# Patient Record
Sex: Male | Born: 1985 | Race: Black or African American | Hispanic: No | Marital: Single | Smoking: Current every day smoker
Health system: Southern US, Community
[De-identification: ages and names within clinical notes are randomized; demographics above are authoritative.]

## PROBLEM LIST (undated history)

## (undated) DIAGNOSIS — B20 Human immunodeficiency virus [HIV] disease: Secondary | ICD-10-CM

---

## 2016-09-02 ENCOUNTER — Encounter (HOSPITAL_COMMUNITY): Payer: Self-pay | Admitting: Emergency Medicine

## 2016-09-02 ENCOUNTER — Emergency Department (HOSPITAL_COMMUNITY)
Admission: EM | Admit: 2016-09-02 | Discharge: 2016-09-02 | Disposition: A | Discharge status: Home / Self Care | Payer: Self-pay | Attending: Emergency Medicine | Admitting: Emergency Medicine

## 2016-09-02 DIAGNOSIS — F10920 Alcohol use, unspecified with intoxication, uncomplicated: Secondary | ICD-10-CM

## 2016-09-02 DIAGNOSIS — R112 Nausea with vomiting, unspecified: Secondary | ICD-10-CM | POA: Insufficient documentation

## 2016-09-02 DIAGNOSIS — F17210 Nicotine dependence, cigarettes, uncomplicated: Secondary | ICD-10-CM | POA: Insufficient documentation

## 2016-09-02 DIAGNOSIS — F10120 Alcohol abuse with intoxication, uncomplicated: Secondary | ICD-10-CM | POA: Insufficient documentation

## 2016-09-02 HISTORY — DX: Human immunodeficiency virus (HIV) disease: B20

## 2016-09-02 LAB — CBC WITH DIFFERENTIAL/PLATELET
BASOS ABS: 0 10*3/uL (ref 0.0–0.1)
BASOS PCT: 0 %
Eosinophils Absolute: 0 10*3/uL (ref 0.0–0.7)
Eosinophils Relative: 0 %
HEMATOCRIT: 41.8 % (ref 39.0–52.0)
HEMOGLOBIN: 15.2 g/dL (ref 13.0–17.0)
Lymphocytes Relative: 31 %
Lymphs Abs: 2.1 10*3/uL (ref 0.7–4.0)
MCH: 32.3 pg (ref 26.0–34.0)
MCHC: 36.4 g/dL — ABNORMAL HIGH (ref 30.0–36.0)
MCV: 88.9 fL (ref 78.0–100.0)
Monocytes Absolute: 0.6 10*3/uL (ref 0.1–1.0)
Monocytes Relative: 9 %
NEUTROS ABS: 4.1 10*3/uL (ref 1.7–7.7)
NEUTROS PCT: 60 %
Platelets: 160 10*3/uL (ref 150–400)
RBC: 4.7 MIL/uL (ref 4.22–5.81)
RDW: 13.2 % (ref 11.5–15.5)
WBC: 6.8 10*3/uL (ref 4.0–10.5)

## 2016-09-02 LAB — URINALYSIS, ROUTINE W REFLEX MICROSCOPIC
Bilirubin Urine: NEGATIVE
Glucose, UA: NEGATIVE mg/dL
KETONES UR: 15 mg/dL — AB
LEUKOCYTES UA: NEGATIVE
NITRITE: NEGATIVE
PH: 6 (ref 5.0–8.0)
Protein, ur: 100 mg/dL — AB
SPECIFIC GRAVITY, URINE: 1.039 — AB (ref 1.005–1.030)

## 2016-09-02 LAB — COMPREHENSIVE METABOLIC PANEL
ALK PHOS: 94 U/L (ref 38–126)
ALT: 124 U/L — ABNORMAL HIGH (ref 17–63)
ANION GAP: 15 (ref 5–15)
AST: 176 U/L — ABNORMAL HIGH (ref 15–41)
Albumin: 4.6 g/dL (ref 3.5–5.0)
BILIRUBIN TOTAL: 0.8 mg/dL (ref 0.3–1.2)
BUN: 6 mg/dL (ref 6–20)
CALCIUM: 9.3 mg/dL (ref 8.9–10.3)
CO2: 25 mmol/L (ref 22–32)
Chloride: 100 mmol/L — ABNORMAL LOW (ref 101–111)
Creatinine, Ser: 0.76 mg/dL (ref 0.61–1.24)
GFR calc non Af Amer: >60 mL/min (ref >60)
Glucose, Bld: 115 mg/dL — ABNORMAL HIGH (ref 65–99)
POTASSIUM: 3.1 mmol/L — AB (ref 3.5–5.1)
SODIUM: 140 mmol/L (ref 135–145)
TOTAL PROTEIN: 8.3 g/dL — AB (ref 6.5–8.1)

## 2016-09-02 LAB — URINE MICROSCOPIC-ADD ON

## 2016-09-02 LAB — ETHANOL: Alcohol, Ethyl (B): 242 mg/dL — ABNORMAL HIGH (ref <5)

## 2016-09-02 MED ORDER — SODIUM CHLORIDE 0.9 % IV SOLN
1000.0000 mL | Freq: Once | INTRAVENOUS | Status: AC
  Administered 2016-09-02: 1000 mL via INTRAVENOUS

## 2016-09-02 MED ORDER — LORAZEPAM 1 MG PO TABS
1.0000 mg | ORAL_TABLET | Freq: Four times a day (QID) | ORAL | 0 refills | Status: AC | PRN
Start: 2016-09-02 — End: ?

## 2016-09-02 MED ORDER — POTASSIUM CHLORIDE CRYS ER 20 MEQ PO TBCR
40.0000 meq | EXTENDED_RELEASE_TABLET | Freq: Once | ORAL | Status: DC
  Filled 2016-09-02: qty 2

## 2016-09-02 MED ORDER — LORAZEPAM 2 MG/ML IJ SOLN
1.0000 mg | Freq: Once | INTRAMUSCULAR | Status: AC
  Administered 2016-09-02: 1 mg via INTRAVENOUS
  Filled 2016-09-02: qty 1

## 2016-09-02 MED ORDER — ONDANSETRON HCL 4 MG PO TABS: 4.0000 mg | ORAL_TABLET | Freq: Four times a day (QID) | ORAL | 0 refills | Status: AC

## 2016-09-02 MED ORDER — DIPHENHYDRAMINE HCL 50 MG/ML IJ SOLN
25.0000 mg | Freq: Once | INTRAMUSCULAR | Status: AC
  Administered 2016-09-02: 25 mg via INTRAVENOUS
  Filled 2016-09-02: qty 1

## 2016-09-02 MED ORDER — SODIUM CHLORIDE 0.9 % IV SOLN
1000.0000 mL | INTRAVENOUS | Status: DC
  Administered 2016-09-02: 1000 mL via INTRAVENOUS

## 2016-09-02 MED ORDER — ONDANSETRON HCL 4 MG/2ML IJ SOLN
4.0000 mg | Freq: Once | INTRAMUSCULAR | Status: AC
  Administered 2016-09-02: 4 mg via INTRAVENOUS
  Filled 2016-09-02: qty 2

## 2016-09-02 MED ORDER — METOCLOPRAMIDE HCL 5 MG/ML IJ SOLN
10.0000 mg | Freq: Once | INTRAMUSCULAR | Status: AC
Start: 2016-09-02 — End: 2016-09-02
  Administered 2016-09-02: 10 mg via INTRAVENOUS
  Filled 2016-09-02: qty 2

## 2016-09-02 MED ORDER — POTASSIUM CHLORIDE 10 MEQ/100ML IV SOLN
10.0000 meq | Freq: Once | INTRAVENOUS | Status: AC
  Administered 2016-09-02: 10 meq via INTRAVENOUS
  Filled 2016-09-02: qty 100

## 2016-09-02 NOTE — ED Provider Notes (Signed)
[facility] DEPT Provider Note   CSN: [id number] Arrival date & time: 09/02/16  0205  By signing my name below, I, Hansel Feinstein, attest that this documentation has been prepared under the direction and in the presence of Delora Fuel, MD. Electronically Signed: Hansel Feinstein, ED Scribe. 09/02/16. 2:36 AM.     History   Chief Complaint Chief Complaint  Patient presents with  . Abdominal Pain  . Emesis  . Diarrhea    HPI Jesse Medina is a 30 y.o. male with h/o HIV who presents to the Emergency Department complaining of moderate, gradually worsening lightheadedness onset 3 days ago with associated abdominal pain, nausea, multiple episodes of emesis. Pt also complains that he has been trembling for 2 days, which has unchanged since onset. Pt is a daily drinker, consuming ~1 pint of liquor qd. He reports he has continued to consume alcohol since onset of his symptoms. Pt reports recent sick contact with brother, and reports his symptoms are not similar. Pt denies illegal drug use. He also denies fever, diarrhea.   The history is provided by the patient. No language interpreter was used.    Past Medical History:  Diagnosis Date  . HIV (human immunodeficiency virus infection) ([place])     There are no active problems to display for this patient.   History reviewed. No pertinent surgical history.     Home Medications    Prior to Admission medications   Medication Sig Start Date End Date Taking? Authorizing Provider  elvitegravir-cobicistat-emtricitabine-tenofovir (STRIBILD) 150-150-200-300 MG TABS tablet Take 1 tablet by mouth daily with breakfast.   Yes Historical Provider, MD    Family History History reviewed. No pertinent family history.  Social History Social History  Substance Use Topics  . Smoking status: Current Every Day Smoker    Packs/day: 0.50    Years: 10.00    Types: Cigarettes  . Smokeless tobacco: Never Used  . Alcohol use Yes     Comment: pt reports  pint of liquor every 2 days     Allergies   Shellfish allergy   Review of Systems Review of Systems  Constitutional: Negative for fever.       + trembling   Gastrointestinal: Positive for abdominal pain and vomiting. Negative for diarrhea.  Neurological: Positive for light-headedness.  All other systems reviewed and are negative.    Physical Exam Updated Vital Signs BP (!) 160/111 (BP Location: Right Arm)   Pulse 85   Temp 99 F (37.2 C) (Oral)   Resp 15   Ht 5\' 9"  (1.753 m)   Wt 179 lb (81.2 kg)   SpO2 97%   BMI 26.43 kg/m   Physical Exam  Constitutional: He is oriented to person, place, and time. He appears well-developed and well-nourished.  HENT:  Head: Normocephalic and atraumatic.  Eyes: EOM are normal. Pupils are equal, round, and reactive to light.  Neck: Normal range of motion. Neck supple. No JVD present.  Cardiovascular: Normal rate, regular rhythm and normal heart sounds.   No murmur heard. Pulmonary/Chest: Effort normal and breath sounds normal. He has no wheezes. He has no rales. He exhibits no tenderness.  Abdominal: Soft. He exhibits no distension and no mass. There is tenderness. There is no rebound and no guarding.  Mild tenderness diffusely. No rebound or guarding. Bowel sounds decreased.   Musculoskeletal: Normal range of motion. He exhibits no edema.  Lymphadenopathy:    He has no cervical adenopathy.  Neurological: He is alert and oriented to person, place,  and time. No cranial nerve deficit. He exhibits normal muscle tone. Coordination normal.  Mildly tremulous   Skin: Skin is warm and dry. No rash noted.  Psychiatric: He has a normal mood and affect. His behavior is normal. Judgment and thought content normal.  Nursing note and vitals reviewed.    ED Treatments / Results   DIAGNOSTIC STUDIES: Oxygen Saturation is 97% on RA, normal by my interpretation.    COORDINATION OF CARE: 2:34 AM Discussed treatment plan with pt at bedside which  includes lab work and pt agreed to plan.    Labs (all labs ordered are listed, but only abnormal results are displayed) Labs Reviewed  ETHANOL - Abnormal; Notable for the following:       Result Value   Alcohol, Ethyl (B) 242 (*)    All other components within normal limits  COMPREHENSIVE METABOLIC PANEL - Abnormal; Notable for the following:    Potassium 3.1 (*)    Chloride 100 (*)    Glucose, Bld 115 (*)    Total Protein 8.3 (*)    AST 176 (*)    ALT 124 (*)    All other components within normal limits  CBC WITH DIFFERENTIAL/PLATELET - Abnormal; Notable for the following:    MCHC 36.4 (*)    All other components within normal limits  URINALYSIS, ROUTINE W REFLEX MICROSCOPIC (NOT AT [facility]) - Abnormal; Notable for the following:    Specific Gravity, Urine 1.039 (*)    Hgb urine dipstick SMALL (*)    Ketones, ur 15 (*)    Protein, ur 100 (*)    All other components within normal limits  URINE MICROSCOPIC-ADD ON - Abnormal; Notable for the following:    Squamous Epithelial / LPF 0-5 (*)    Bacteria, UA RARE (*)    Crystals CA OXALATE CRYSTALS (*)    All other components within normal limits    EKG  EKG Interpretation  Date/Time:  Monday September 02 2016 02:18:26 EDT Ventricular Rate:  82 PR Interval:    QRS Duration: 87 QT Interval:  362 QTC Calculation: 423 R Axis:   39 Text Interpretation:  Sinus rhythm RSR' in V1 or V2, probably normal variant Baseline wander in lead(s) V1 No old tracing to compare Confirmed by [facility]  MD, Cleven Jansma ([id number]) on 09/02/2016 2:26:02 AM       Procedures Procedures (including critical care time)  Medications Ordered in ED Medications  0.9 %  sodium chloride infusion (0 mLs Intravenous Stopped 09/02/16 0429)    Followed by  0.9 %  sodium chloride infusion (1,000 mLs Intravenous New Bag/Given 09/02/16 0430)  potassium chloride SA (K-DUR,KLOR-CON) CR tablet 40 mEq (40 mEq Oral Not Given 09/02/16 0415)  ondansetron (ZOFRAN) injection 4 mg (4  mg Intravenous Given 09/02/16 0255)  LORazepam (ATIVAN) injection 1 mg (1 mg Intravenous Given 09/02/16 0255)  metoCLOPramide (REGLAN) injection 10 mg (10 mg Intravenous Given 09/02/16 0417)  diphenhydrAMINE (BENADRYL) injection 25 mg (25 mg Intravenous Given 09/02/16 0417)  potassium chloride 10 mEq in 100 mL IVPB (0 mEq Intravenous Stopped 09/02/16 0529)  LORazepam (ATIVAN) injection 1 mg (1 mg Intravenous Given 09/02/16 0503)     Initial Impression / Assessment and Plan / ED Course  I have reviewed the triage vital signs and the nursing notes.  Pertinent labs & imaging results that were available during my care of the patient were reviewed by me and considered in my medical decision making (see chart for details).  Clinical Course  Nausea and vomiting. Tremulousness may be related to alcohol withdrawal. He is given IV fluids and IV ondansetron with good relief of nausea. He was given lorazepam and continued to be tremulous. He was given second dose of lorazepam with significant improvement. Laboratory workup demonstrates alcohol intoxication and hypokalemia, but no other acute process. He was given oral and intravenous potassium. He is discharged with prescription for ondansetron and lorazepam.  Final Clinical Impressions(s) / ED Diagnoses   Final diagnoses:  Non-intractable vomiting with nausea, unspecified vomiting type  Alcohol intoxication, uncomplicated (HCC)    New Prescriptions New Prescriptions   LORAZEPAM (ATIVAN) 1 MG TABLET    Take 1 tablet (1 mg total) by mouth every 6 (six) hours as needed for anxiety.   ONDANSETRON (ZOFRAN) 4 MG TABLET    Take 1 tablet (4 mg total) by mouth every 6 (six) hours.    I personally performed the services described in this documentation, which was scribed in my presence. The recorded information has been reviewed and is accurate.       Delora Fuel, MD [id number]

## 2016-09-02 NOTE — ED Triage Notes (Signed)
Pt presents from home with generalized abd pain x 3 days with nausea, emesis; pt reports he consumes ETOH daily; pt denies urinary symptoms; pt reports possible fever; pt states today he felt lightheaded but denies LOC

## 2016-09-02 NOTE — ED Notes (Signed)
Pt continues to report anxiety; MD Roxanne Mins notified

## 2016-09-02 NOTE — ED Notes (Signed)
Pt reporting improvement in nausea but anxiousness remains, MD Roxanne Mins made aware

## 2016-09-06 ENCOUNTER — Emergency Department (HOSPITAL_COMMUNITY)
Admission: EM | Admit: 2016-09-06 | Discharge: 2016-09-06 | Disposition: A | Discharge status: Home / Self Care | Payer: Self-pay | Attending: Emergency Medicine | Admitting: Emergency Medicine

## 2016-09-06 ENCOUNTER — Encounter (HOSPITAL_COMMUNITY): Payer: Self-pay | Admitting: Emergency Medicine

## 2016-09-06 DIAGNOSIS — R1030 Lower abdominal pain, unspecified: Secondary | ICD-10-CM | POA: Insufficient documentation

## 2016-09-06 DIAGNOSIS — F10930 Alcohol use, unspecified with withdrawal, uncomplicated: Secondary | ICD-10-CM

## 2016-09-06 DIAGNOSIS — F10239 Alcohol dependence with withdrawal, unspecified: Secondary | ICD-10-CM | POA: Insufficient documentation

## 2016-09-06 DIAGNOSIS — F17210 Nicotine dependence, cigarettes, uncomplicated: Secondary | ICD-10-CM | POA: Insufficient documentation

## 2016-09-06 DIAGNOSIS — F10230 Alcohol dependence with withdrawal, uncomplicated: Secondary | ICD-10-CM

## 2016-09-06 LAB — COMPREHENSIVE METABOLIC PANEL
ALBUMIN: 4.5 g/dL (ref 3.5–5.0)
ALT: 155 U/L — ABNORMAL HIGH (ref 17–63)
AST: 206 U/L — AB (ref 15–41)
Alkaline Phosphatase: 97 U/L (ref 38–126)
Anion gap: 13 (ref 5–15)
BUN: 9 mg/dL (ref 6–20)
CHLORIDE: 99 mmol/L — AB (ref 101–111)
CO2: 26 mmol/L (ref 22–32)
Calcium: 9.5 mg/dL (ref 8.9–10.3)
Creatinine, Ser: 0.76 mg/dL (ref 0.61–1.24)
GFR calc Af Amer: >60 mL/min (ref >60)
GLUCOSE: 117 mg/dL — AB (ref 65–99)
POTASSIUM: 3.3 mmol/L — AB (ref 3.5–5.1)
SODIUM: 138 mmol/L (ref 135–145)
Total Bilirubin: 0.9 mg/dL (ref 0.3–1.2)
Total Protein: 8.2 g/dL — ABNORMAL HIGH (ref 6.5–8.1)

## 2016-09-06 LAB — URINALYSIS, ROUTINE W REFLEX MICROSCOPIC
GLUCOSE, UA: NEGATIVE mg/dL
Hgb urine dipstick: NEGATIVE
KETONES UR: 15 mg/dL — AB
LEUKOCYTES UA: NEGATIVE
Nitrite: NEGATIVE
PH: 6.5 (ref 5.0–8.0)
Protein, ur: 100 mg/dL — AB
Specific Gravity, Urine: 1.029 (ref 1.005–1.030)

## 2016-09-06 LAB — CBC
HEMATOCRIT: 44 % (ref 39.0–52.0)
Hemoglobin: 15.8 g/dL (ref 13.0–17.0)
MCH: 32.4 pg (ref 26.0–34.0)
MCHC: 35.9 g/dL (ref 30.0–36.0)
MCV: 90.2 fL (ref 78.0–100.0)
Platelets: 106 10*3/uL — ABNORMAL LOW (ref 150–400)
RBC: 4.88 MIL/uL (ref 4.22–5.81)
RDW: 13.4 % (ref 11.5–15.5)
WBC: 5.5 10*3/uL (ref 4.0–10.5)

## 2016-09-06 LAB — URINE MICROSCOPIC-ADD ON: RBC / HPF: NONE SEEN RBC/hpf (ref 0–5)

## 2016-09-06 LAB — LIPASE, BLOOD: LIPASE: 30 U/L (ref 11–51)

## 2016-09-06 MED ORDER — CHLORDIAZEPOXIDE HCL 25 MG PO CAPS: 25.0000 mg | ORAL_CAPSULE | Freq: Three times a day (TID) | ORAL | 0 refills | Status: DC | PRN

## 2016-09-06 MED ORDER — METOCLOPRAMIDE HCL 10 MG PO TABS
10.0000 mg | ORAL_TABLET | Freq: Once | ORAL | Status: AC
  Administered 2016-09-06: 10 mg via ORAL
  Filled 2016-09-06: qty 1

## 2016-09-06 MED ORDER — DIAZEPAM 5 MG/ML IJ SOLN
10.0000 mg | Freq: Once | INTRAMUSCULAR | Status: AC
  Administered 2016-09-06: 10 mg via INTRAVENOUS
  Filled 2016-09-06: qty 2

## 2016-09-06 MED ORDER — SODIUM CHLORIDE 0.9 % IV BOLUS (SEPSIS)
2000.0000 mL | Freq: Once | INTRAVENOUS | Status: AC
  Administered 2016-09-06: 2000 mL via INTRAVENOUS

## 2016-09-06 MED ORDER — FAMOTIDINE IN NACL 20-0.9 MG/50ML-% IV SOLN
20.0000 mg | Freq: Once | INTRAVENOUS | Status: AC
  Administered 2016-09-06: 20 mg via INTRAVENOUS
  Filled 2016-09-06: qty 50

## 2016-09-06 NOTE — ED Provider Notes (Signed)
Jesse Medina, Jesse Medina, Jesse Medina, Jesse that this documentation has been prepared under the direction and in the presence of Jesse Medina, Jesse Medina, Jesse Medina  . Abdominal Pain  . Hematochezia   The history is provided by the patient. No language interpreter was used.    HPI Comments:  Jesse Medina is a 30 y.o. male Medina pmhx of HIV who presents to the Emergency Department complaining of sudden-onset constant gradually worsening lower abdominal pain beginning about 7 days ago.  Pt states that he was seen 4 days ago for similar pain, but his symptoms have worsened. He reports associated weakness, tremors, blurry vision, vomiting, bloody stool, decreased appetite, and diaphoresis. He also reports that his urine has been darker than usual. Pt denies any fever or dysuria. Pt also denies any sick contacts. He reports his last CD4 count was above 1100 in January.   Past Medical History:  Diagnosis Date  . HIV (human immunodeficiency virus infection) ([place])     There are no active problems to display for this patient.   History reviewed. No pertinent surgical history.  Home Medications    Prior to Admission medications   Medication Sig Start Date End Date Taking? Authorizing Provider  elvitegravir-cobicistat-emtricitabine-tenofovir (STRIBILD) 150-150-200-300 MG TABS tablet Take 1 tablet by mouth daily Medina breakfast.    Historical Provider, Jesse  LORazepam (ATIVAN) 1 MG tablet Take 1 tablet (1 mg total) by mouth every 6 (six) hours as needed for anxiety. Q000111Q   Delora Fuel, Jesse  ondansetron (ZOFRAN) 4 MG tablet Take 1 tablet (4 mg total) by mouth every 6 (six) hours. [id number]   Delora Fuel, Jesse    Family History No family history on file.  Social History Social  History  Substance Use Topics  . Smoking status: Current Every Day Smoker    Packs/day: 0.00    Years: 0.00    Types: Cigarettes  . Smokeless tobacco: Never Used  . Alcohol use Yes     Comment: pt reports pint of liquor every 2 days     Allergies   Shellfish allergy   Review of Systems Review of Systems 10 Systems reviewed and are negative for acute change except as noted in the HPI.  Physical Exam Updated Vital Signs BP (!) 140/103 (BP Location: Left Arm)   Pulse (!) 124   Temp 97.2 F (36.2 C) (Oral)   Resp 20   SpO2 96%   Physical Exam  Constitutional: He is oriented to person, place, and time. Vital signs are normal. He appears well-developed and well-nourished.  Non-toxic appearance. He does not appear ill. No distress.  Tremulous. Diaphoretic  HENT:  Head: Normocephalic and atraumatic.  Nose: Nose normal.  Mouth/Throat: Oropharynx is clear and moist. No oropharyngeal exudate.  Eyes: Conjunctivae and EOM are normal. Pupils are equal, round, and reactive to light. No scleral icterus.  Neck: Normal range of motion. Neck supple. No tracheal deviation, no edema, no erythema and normal range of motion present. No thyroid mass and no thyromegaly present.  Cardiovascular: Regular rhythm, S1 normal, S2 normal, normal heart sounds, intact distal pulses and normal pulses.  Exam reveals no gallop and no friction rub.   No murmur heard. Tachycardic  Pulmonary/Chest: Effort normal and breath sounds normal. No respiratory distress. He  has no wheezes. He has no rhonchi. He has no rales.  Abdominal: Soft. Normal appearance and bowel sounds are normal. He exhibits no distension, no ascites and no mass. There is no hepatosplenomegaly. There is no tenderness. There is no rebound, no guarding and no CVA tenderness.  Musculoskeletal: Normal range of motion. He exhibits no edema or tenderness.  Lymphadenopathy:    He has no cervical adenopathy.  Neurological: He is alert and oriented to  person, place, and time. He has normal strength. No cranial nerve deficit or sensory deficit.  Skin: Skin is warm and intact. No petechiae and no rash noted. He is diaphoretic. No erythema. No pallor.  Nursing note and vitals reviewed.    ED Treatments / Results  DIAGNOSTIC STUDIES:  Oxygen Saturation is 96% on room air, adequate by my interpretation.    COORDINATION OF CARE:  3:23 AM Discussed treatment plan Medina pt at bedside and pt agreed to plan.  Labs (all labs ordered are listed, but only abnormal results are displayed) Labs Reviewed  LIPASE, BLOOD  COMPREHENSIVE METABOLIC PANEL  CBC  URINALYSIS, ROUTINE W REFLEX MICROSCOPIC (NOT AT [facility])    EKG  EKG Interpretation None       Radiology No results found.  Procedures Procedures (including critical care time)  Medications Ordered in ED Medications - No data to display   Initial Impression / Assessment and Plan / ED Course  Jesse Medina have reviewed the triage vital signs and the nursing notes.  Pertinent labs & imaging results that were available during my care of the patient were reviewed by me and considered in my medical decision making (see chart for details).  Clinical Course    Patient presents to the ED for lower abdominal pain.  He has no pain on PE.  Labs and UA are negative.  He was just seen and DC Medina ativan but states he has not taken any out of fear of taking it Medina alcohol.    He also has some mild alcohol withdrawal and was given valium 20mg .  This relieved his tremors.  VS are all normal. He was advised to stop drinking and to take the previously prescribed ativan as needed for withdrawal.  PCP fu also provided to him.  He appears well and in NAD. VS remain within his normal limits and he is safe for DC.  Final Clinical Impressions(s) / ED Diagnoses   Final diagnoses:  None    New Prescriptions New Prescriptions   No medications on file    Jesse Medina personally performed the services described in  this documentation, which was scribed in my presence. The recorded information has been reviewed and is accurate.       Jesse Balls, Jesse 09/06/16 [phone]

## 2016-09-06 NOTE — ED Triage Notes (Signed)
Pt. reports low abdominal pain with emesis , chills  and bloody diarrhea onset this week . Denies fever , seen here 4 days ago for persistent emesis .

## 2016-10-07 ENCOUNTER — Emergency Department (HOSPITAL_COMMUNITY)
Admission: EM | Admit: 2016-10-07 | Discharge: 2016-10-07 | Disposition: A | Discharge status: Home / Self Care | Payer: Medicaid - Out of State | Attending: Emergency Medicine | Admitting: Emergency Medicine

## 2016-10-07 ENCOUNTER — Emergency Department (HOSPITAL_COMMUNITY): Payer: Medicaid - Out of State

## 2016-10-07 ENCOUNTER — Encounter (HOSPITAL_COMMUNITY): Payer: Self-pay | Admitting: *Deleted

## 2016-10-07 DIAGNOSIS — K760 Fatty (change of) liver, not elsewhere classified: Secondary | ICD-10-CM

## 2016-10-07 DIAGNOSIS — R1011 Right upper quadrant pain: Secondary | ICD-10-CM | POA: Diagnosis present

## 2016-10-07 DIAGNOSIS — F10239 Alcohol dependence with withdrawal, unspecified: Secondary | ICD-10-CM | POA: Insufficient documentation

## 2016-10-07 DIAGNOSIS — F10939 Alcohol use, unspecified with withdrawal, unspecified: Secondary | ICD-10-CM

## 2016-10-07 DIAGNOSIS — D1803 Hemangioma of intra-abdominal structures: Secondary | ICD-10-CM

## 2016-10-07 DIAGNOSIS — F17210 Nicotine dependence, cigarettes, uncomplicated: Secondary | ICD-10-CM | POA: Diagnosis not present

## 2016-10-07 DIAGNOSIS — R109 Unspecified abdominal pain: Secondary | ICD-10-CM

## 2016-10-07 LAB — BASIC METABOLIC PANEL
ANION GAP: 18 — AB (ref 5–15)
CALCIUM: 11.1 mg/dL — AB (ref 8.9–10.3)
CO2: 25 mmol/L (ref 22–32)
Chloride: 88 mmol/L — ABNORMAL LOW (ref 101–111)
Creatinine, Ser: 0.78 mg/dL (ref 0.61–1.24)
GFR calc Af Amer: >60 mL/min (ref >60)
Glucose, Bld: 121 mg/dL — ABNORMAL HIGH (ref 65–99)
POTASSIUM: 3.1 mmol/L — AB (ref 3.5–5.1)
SODIUM: 131 mmol/L — AB (ref 135–145)

## 2016-10-07 LAB — URINALYSIS, ROUTINE W REFLEX MICROSCOPIC
Bilirubin Urine: NEGATIVE
Glucose, UA: NEGATIVE mg/dL
HGB URINE DIPSTICK: NEGATIVE
KETONES UR: 15 mg/dL — AB
Leukocytes, UA: NEGATIVE
Nitrite: NEGATIVE
PROTEIN: NEGATIVE mg/dL
Specific Gravity, Urine: 1.008 (ref 1.005–1.030)
pH: 6 (ref 5.0–8.0)

## 2016-10-07 LAB — CBC
HEMATOCRIT: 42.8 % (ref 39.0–52.0)
HEMOGLOBIN: 15.5 g/dL (ref 13.0–17.0)
MCH: 32.1 pg (ref 26.0–34.0)
MCHC: 36.2 g/dL — ABNORMAL HIGH (ref 30.0–36.0)
MCV: 88.6 fL (ref 78.0–100.0)
Platelets: 79 10*3/uL — ABNORMAL LOW (ref 150–400)
RBC: 4.83 MIL/uL (ref 4.22–5.81)
RDW: 13.1 % (ref 11.5–15.5)
WBC: 7.5 10*3/uL (ref 4.0–10.5)

## 2016-10-07 LAB — PROTIME-INR
INR: 0.93
Prothrombin Time: 12.5 seconds (ref 11.4–15.2)

## 2016-10-07 LAB — HEPATIC FUNCTION PANEL
ALK PHOS: 78 U/L (ref 38–126)
ALT: 251 U/L — AB (ref 17–63)
AST: 380 U/L — AB (ref 15–41)
Albumin: 4.8 g/dL (ref 3.5–5.0)
BILIRUBIN DIRECT: 0.4 mg/dL (ref 0.1–0.5)
BILIRUBIN INDIRECT: 1 mg/dL — AB (ref 0.3–0.9)
BILIRUBIN TOTAL: 1.4 mg/dL — AB (ref 0.3–1.2)
Total Protein: 8.9 g/dL — ABNORMAL HIGH (ref 6.5–8.1)

## 2016-10-07 LAB — I-STAT TROPONIN, ED: TROPONIN I, POC: 0 ng/mL (ref 0.00–0.08)

## 2016-10-07 LAB — CBG MONITORING, ED: GLUCOSE-CAPILLARY: 144 mg/dL — AB (ref 65–99)

## 2016-10-07 LAB — LIPASE, BLOOD: Lipase: 32 U/L (ref 11–51)

## 2016-10-07 MED ORDER — SODIUM CHLORIDE 0.9 % IV BOLUS (SEPSIS)
1000.0000 mL | Freq: Once | INTRAVENOUS | Status: AC
  Administered 2016-10-07: 1000 mL via INTRAVENOUS

## 2016-10-07 MED ORDER — ONDANSETRON 4 MG PO TBDP
4.0000 mg | ORAL_TABLET | Freq: Three times a day (TID) | ORAL | 0 refills | Status: AC | PRN
Start: 2016-10-07 — End: ?

## 2016-10-07 MED ORDER — FENTANYL CITRATE (PF) 100 MCG/2ML IJ SOLN
50.0000 ug | Freq: Once | INTRAMUSCULAR | Status: AC
  Administered 2016-10-07: 50 ug via INTRAVENOUS
  Filled 2016-10-07: qty 2

## 2016-10-07 MED ORDER — IOPAMIDOL (ISOVUE-300) INJECTION 61%
INTRAVENOUS | Status: AC
  Administered 2016-10-07: 100 mL
  Filled 2016-10-07: qty 100

## 2016-10-07 MED ORDER — CHLORDIAZEPOXIDE HCL 25 MG PO CAPS: ORAL_CAPSULE | ORAL | 0 refills | Status: AC

## 2016-10-07 MED ORDER — GI COCKTAIL ~~LOC~~
30.0000 mL | Freq: Once | ORAL | Status: AC
  Administered 2016-10-07: 30 mL via ORAL
  Filled 2016-10-07: qty 30

## 2016-10-07 MED ORDER — LORAZEPAM 2 MG/ML IJ SOLN
1.0000 mg | Freq: Once | INTRAMUSCULAR | Status: AC
  Administered 2016-10-07: 1 mg via INTRAVENOUS
  Filled 2016-10-07: qty 1

## 2016-10-07 MED ORDER — HYDROMORPHONE HCL 2 MG/ML IJ SOLN
1.0000 mg | Freq: Once | INTRAMUSCULAR | Status: AC
  Administered 2016-10-07: 1 mg via INTRAVENOUS
  Filled 2016-10-07: qty 1

## 2016-10-07 NOTE — ED Provider Notes (Signed)
[facility] DEPT Provider Note   CSN: [id number] Arrival date & time: 10/07/16  [id number]     History   Chief Complaint Chief Complaint  Patient presents with  . Chest Pain  . Abdominal Pain  . Loss of Consciousness    HPI Curits Medina is a 30 y.o. male.   Abdominal Pain   This is a recurrent problem. The current episode started more than 2 days ago. The problem occurs constantly. The problem has been gradually worsening. The pain is located in the epigastric region. The quality of the pain is cramping and sharp. The pain is moderate. Associated symptoms include diarrhea, nausea and vomiting. Pertinent negatives include fever. Nothing aggravates the symptoms.    Past Medical History:  Diagnosis Date  . HIV (human immunodeficiency virus infection) ([place])     There are no active problems to display for this patient.   History reviewed. No pertinent surgical history.     Home Medications    Prior to Admission medications   Medication Sig Start Date End Date Taking? Authorizing Provider  elvitegravir-cobicistat-emtricitabine-tenofovir (STRIBILD) 150-150-200-300 MG TABS tablet Take 1 tablet by mouth daily with breakfast.   Yes Historical Provider, MD  LORazepam (ATIVAN) 1 MG tablet Take 1 tablet (1 mg total) by mouth every 6 (six) hours as needed for anxiety. Q000111Q  Yes Delora Fuel, MD  ondansetron (ZOFRAN) 4 MG tablet Take 1 tablet (4 mg total) by mouth every 6 (six) hours. Q000111Q  Yes Delora Fuel, MD  chlordiazePOXIDE (LIBRIUM) 25 MG capsule 50mg  PO TID x 1D, then 25-50mg  PO BID X 1D, then 25-50mg  PO QD X 1D 10/07/16   Merrily Pew, MD  ondansetron (ZOFRAN ODT) 4 MG disintegrating tablet Take 1 tablet (4 mg total) by mouth every 8 (eight) hours as needed for nausea. 10/07/16   Tanna Furry, MD    Family History No family history on file.  Social History Social History  Substance Use Topics  . Smoking status: Current Every Day Smoker    Packs/day: 0.00    Years:  0.00    Types: Cigarettes  . Smokeless tobacco: Never Used  . Alcohol use Yes     Comment: pt reports pint of liquor every 2 days     Allergies   Shellfish allergy   Review of Systems Review of Systems  Constitutional: Positive for fatigue. Negative for fever.  Gastrointestinal: Positive for abdominal pain, diarrhea, nausea and vomiting.  All other systems reviewed and are negative.    Physical Exam Updated Vital Signs BP 135/97   Pulse 83   Temp 98.4 F (36.9 C) (Oral)   Resp 15   Ht 5\' 9"  (1.753 m)   Wt 168 lb (76.2 kg)   SpO2 94%   BMI 24.81 kg/m   Physical Exam  Constitutional: He appears well-developed and well-nourished.  HENT:  Head: Normocephalic and atraumatic.  Eyes: Conjunctivae and EOM are normal.  Neck: Normal range of motion.  Cardiovascular: Normal rate.   Pulmonary/Chest: Effort normal. No respiratory distress.  Abdominal: Soft. He exhibits no distension. There is tenderness.  Musculoskeletal: Normal range of motion.  Neurological: He is alert.  Skin: Skin is warm and dry.  Nursing note and vitals reviewed.    ED Treatments / Results  Labs (all labs ordered are listed, but only abnormal results are displayed) Labs Reviewed  BASIC METABOLIC PANEL - Abnormal; Notable for the following:       Result Value   Sodium 131 (*)    Potassium  3.1 (*)    Chloride 88 (*)    Glucose, Bld 121 (*)    BUN <5 (*)    Calcium 11.1 (*)    Anion gap 18 (*)    All other components within normal limits  CBC - Abnormal; Notable for the following:    MCHC 36.2 (*)    Platelets 79 (*)    All other components within normal limits  URINALYSIS, ROUTINE W REFLEX MICROSCOPIC (NOT AT [facility]) - Abnormal; Notable for the following:    Color, Urine AMBER (*)    Ketones, ur 15 (*)    All other components within normal limits  HEPATIC FUNCTION PANEL - Abnormal; Notable for the following:    Total Protein 8.9 (*)    AST 380 (*)    ALT 251 (*)    Total Bilirubin 1.4  (*)    Indirect Bilirubin 1.0 (*)    All other components within normal limits  CBG MONITORING, ED - Abnormal; Notable for the following:    Glucose-Capillary 144 (*)    All other components within normal limits  LIPASE, BLOOD  PROTIME-INR  I-STAT TROPOININ, ED    EKG  EKG Interpretation None       Radiology Dg Chest 2 View  Result Date: 10/07/2016 CLINICAL DATA:  Vomiting. EXAM: CHEST  2 VIEW COMPARISON:  None FINDINGS: The heart size and mediastinal contours are within normal limits. Both lungs are clear. The visualized skeletal structures are unremarkable. IMPRESSION: No active cardiopulmonary disease. Electronically Signed   By: Kerby Moors M.D.   On: 10/07/2016 11:02   Ct Abdomen Pelvis W Contrast  Result Date: 10/07/2016 CLINICAL DATA:  Vomiting for a week and generalized un well feeling. EXAM: CT ABDOMEN AND PELVIS WITH CONTRAST TECHNIQUE: Multidetector CT imaging of the abdomen and pelvis was performed using the standard protocol following bolus administration of intravenous contrast. CONTRAST:  119mL ISOVUE-300 IOPAMIDOL (ISOVUE-300) INJECTION 61% COMPARISON:  None. FINDINGS: Lower chest:  No contributory findings. Hepatobiliary: Marked steatosis. No cirrhotic changes. 12 mm hypervascular mass in segment 5.No evidence of biliary obstruction or stone. Pancreas: Unremarkable. Spleen: Unremarkable. Adrenals/Urinary Tract: 11 mm left adrenal nodule with low-density homogeneous appearance. No hydronephrosis or stone. Mild, symmetric and nonspecific perinephric stranding. Unremarkable bladder. Stomach/Bowel:  No obstruction. No appendicitis. Vascular/Lymphatic: No acute vascular abnormality. No mass or adenopathy. Reproductive:No pathologic findings. Other: No ascites or pneumoperitoneum. Musculoskeletal: No acute abnormalities. IMPRESSION: 1. Marked hepatic steatosis. 2. 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 flash fill hemangioma but liver MRI  characterization is recommended given the patient's background liver disease. 3. 11 mm left adrenal nodule, statistically an adenoma. Attention on follow-up. Electronically Signed   By: Monte Fantasia M.D.   On: 10/07/2016 13:11   US Abdomen Limited Ruq  Result Date: 10/07/2016 CLINICAL DATA:  Abdominal pain for 1 week.  HIV. EXAM: US ABDOMEN LIMITED - RIGHT UPPER QUADRANT COMPARISON:  CT on 10/07/2016 FINDINGS: Gallbladder: No gallstones or wall thickening visualized. No sonographic Murphy sign noted by sonographer. Common bile duct: Diameter: 3 mm, within normal limits Liver: Diffusely increased attenuation of hepatic parenchyma seen, consistent with diffuse hepatic steatosis. A 1.5 cm hypoechoic lesion is seen in the right hepatic lobe which has nonspecific sonographic features. No other definite liver masses visualized. IMPRESSION: No evidence of gallstones or biliary ductal dilatation. Diffuse hepatic steatosis. 1.5 cm nonspecific hypoechoic mass in right hepatic lobe. Nonemergent abdomen MRI without and with contrast recommended for further characterization. Electronically  Signed   By: Earle Gell M.D.   On: 10/07/2016 16:58    Procedures Procedures (including critical care time)  Medications Ordered in ED Medications  LORazepam (ATIVAN) injection 1 mg (1 mg Intravenous Given 10/07/16 1155)  sodium chloride 0.9 % bolus 1,000 mL (0 mLs Intravenous Stopped 10/07/16 1342)  fentaNYL (SUBLIMAZE) injection 50 mcg (50 mcg Intravenous Given 10/07/16 1155)  iopamidol (ISOVUE-300) 61 % injection (100 mLs  Contrast Given 10/07/16 1230)  gi cocktail (Maalox,Lidocaine,Donnatal) (30 mLs Oral Given 10/07/16 1341)  sodium chloride 0.9 % bolus 1,000 mL (0 mLs Intravenous Stopped 10/07/16 1631)  HYDROmorphone (DILAUDID) injection 1 mg (1 mg Intravenous Given 10/07/16 1527)     Initial Impression / Assessment and Plan / ED Course  I have reviewed the triage vital signs and the nursing notes.  Pertinent  labs & imaging results that were available during my care of the patient were reviewed by me and considered in my medical decision making (see chart for details).  Clinical Course     Workup overall negative. Unsure of cause of symptoms but all seem to have improved prior to discharge. Suspect some of it is related to alcohol and he desires possibly quitting so started on a librium taper and info to get a PCP in the area.   Final Clinical Impressions(s) / ED Diagnoses   Final diagnoses:  Abdominal pain  Alcohol withdrawal syndrome with complication [facility])  Fatty liver  Hemangioma of liver    New Prescriptions Discharge Medication List as of 10/07/2016  5:11 PM       Merrily Pew, MD 10/07/16 1856

## 2016-10-07 NOTE — ED Notes (Signed)
Patient transported to Ultrasound 

## 2016-10-07 NOTE — ED Notes (Signed)
Patient transported to CT 

## 2016-10-07 NOTE — ED Notes (Signed)
Pt stable, ambulatory, states understanding of discharge instructions 

## 2016-10-07 NOTE — ED Triage Notes (Addendum)
PT states that for the past week he has been feeling terrible, hard to sleep and has not been eating.  Pt reports vomiting a lot for the last week.  Pt reports abdominal pain and then started having chest pain over the last 2 days.  Pt is tearful. Pt reports sob. LBM 2 days ago and reported it was black in color. Pt states he actually passed out and hit his head.  Pt states that his eye sight has been messed up for the last week.

## 2017-03-30 IMAGING — CT CT ABD-PELV W/ CM
2 of 4 series · 16 of 46 positions shown, 18 images · IV contrast (Omni 300)
Comparison: None.

CLINICAL DATA: Vomiting for a week and generalized un well feeling.

EXAM:
CT ABDOMEN AND PELVIS WITH CONTRAST
TECHNIQUE: Multidetector CT imaging of the abdomen and pelvis was performed
using the standard protocol following bolus administration of
intravenous contrast.
CONTRAST:  100mL L994QP-CLL IOPAMIDOL (L994QP-CLL) INJECTION 61%

[Series 3: a/p w/ 5mm · axial · 0.71mm/px · z∈[+768,+1193]mm · 13 of 93 slices shown, 15 images]
[im 4/93  soft-tissue]
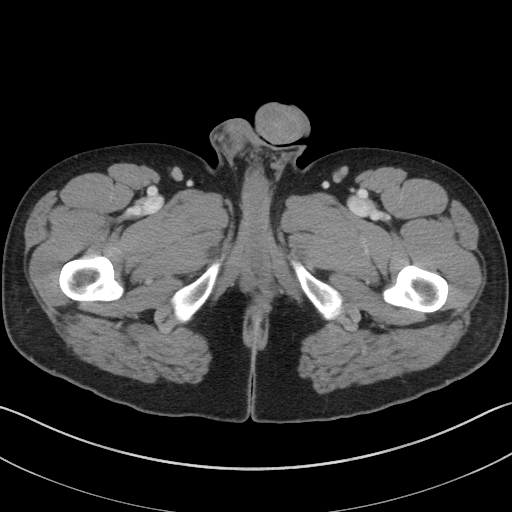
[im 4/93  bone]
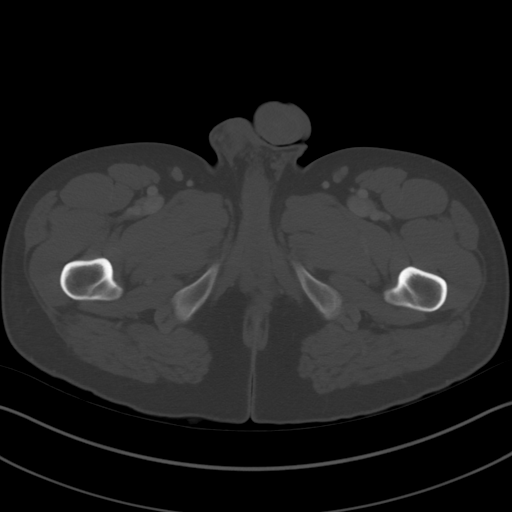
[im 12/93  soft-tissue]
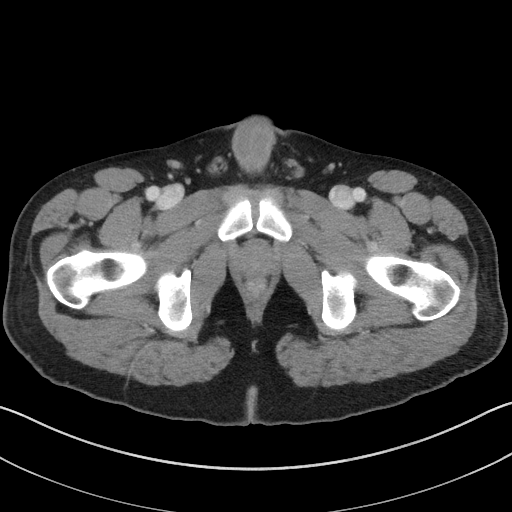
[im 19/93  soft-tissue]
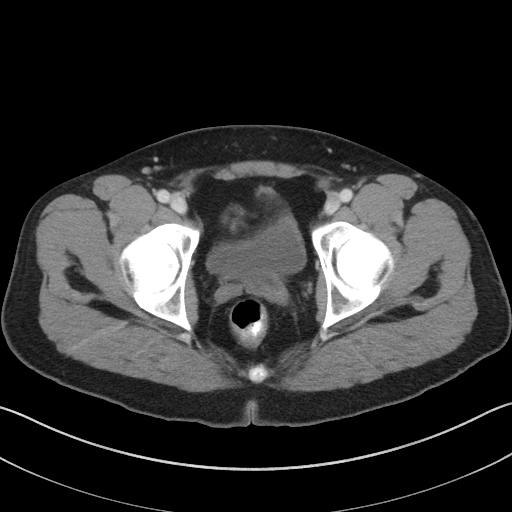
[im 26/93  soft-tissue]
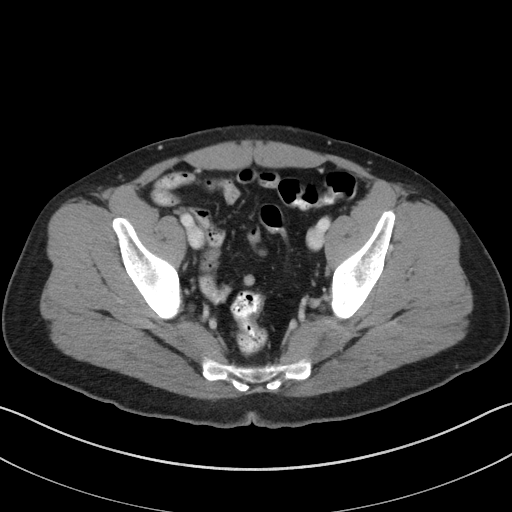
[im 34/93  soft-tissue]
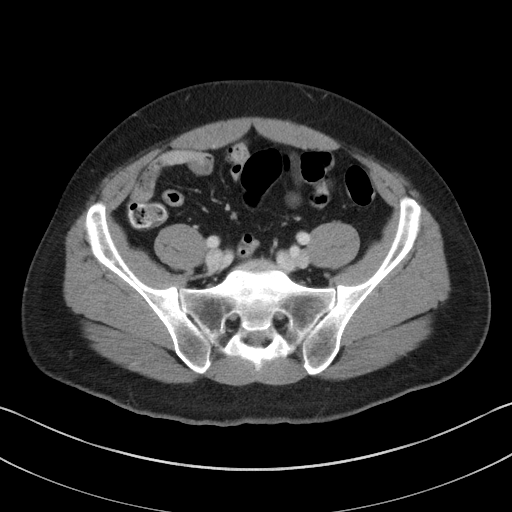
[im 41/93  soft-tissue]
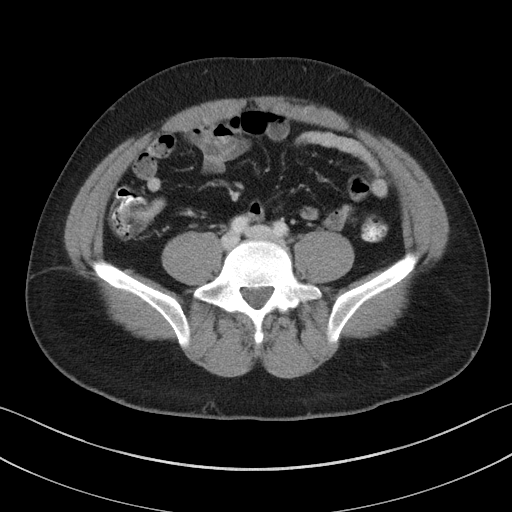
[im 48/93  soft-tissue]
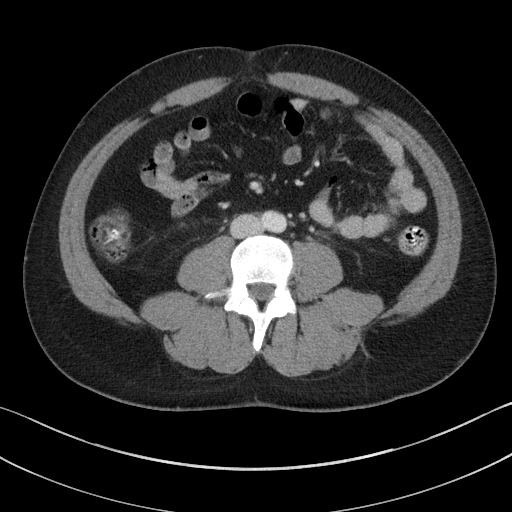
[im 52/93  soft-tissue]
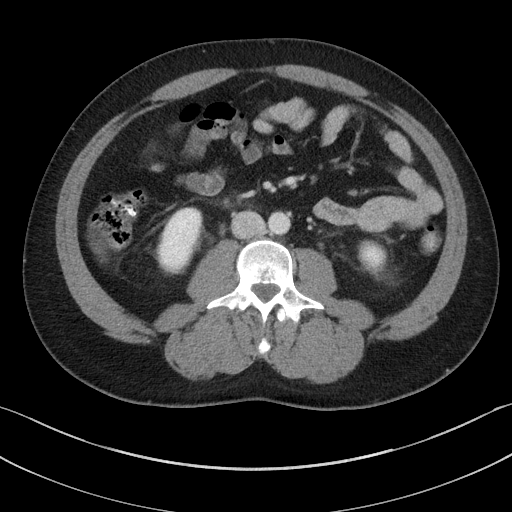
[im 59/93  soft-tissue]
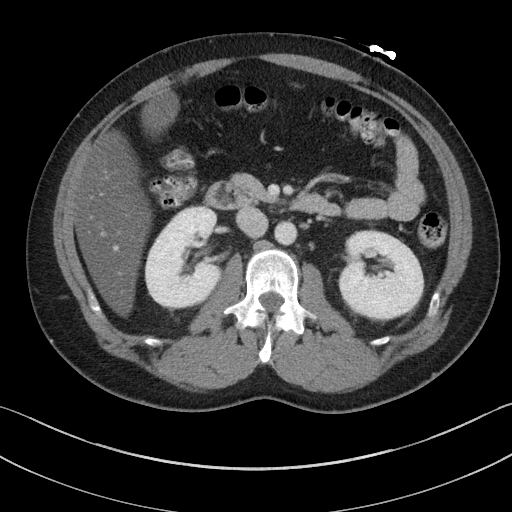
[im 59/93  bone]
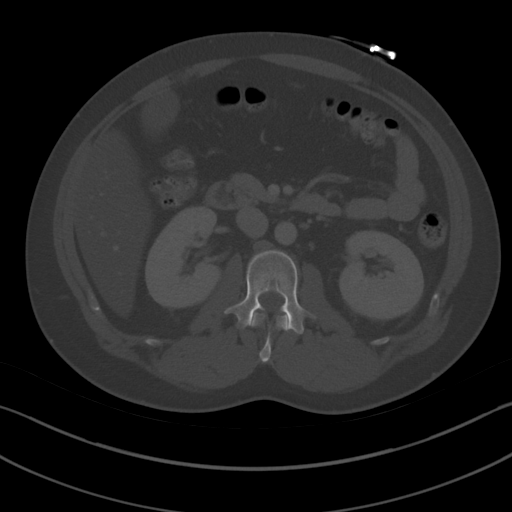
[im 67/93  soft-tissue]
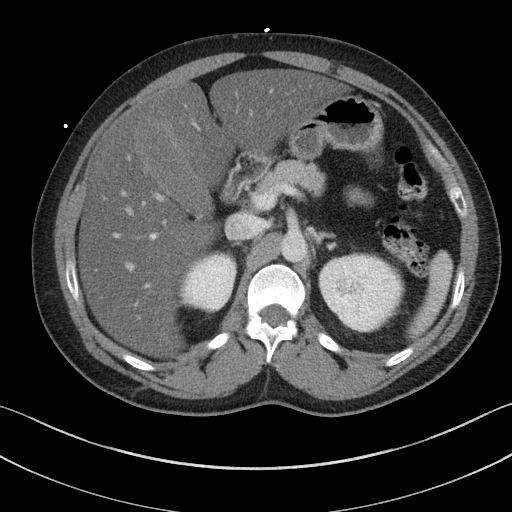
[im 74/93  soft-tissue]
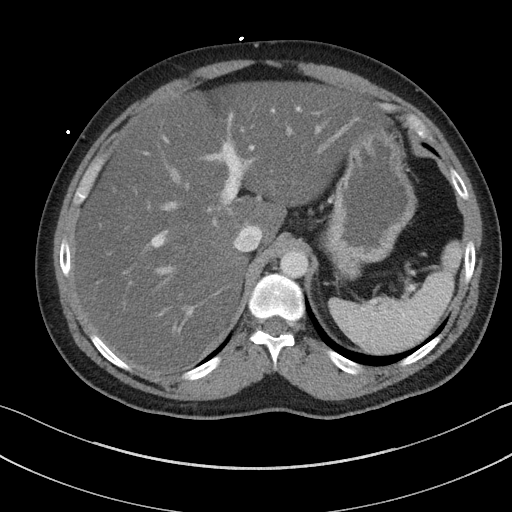
[im 81/93  soft-tissue]
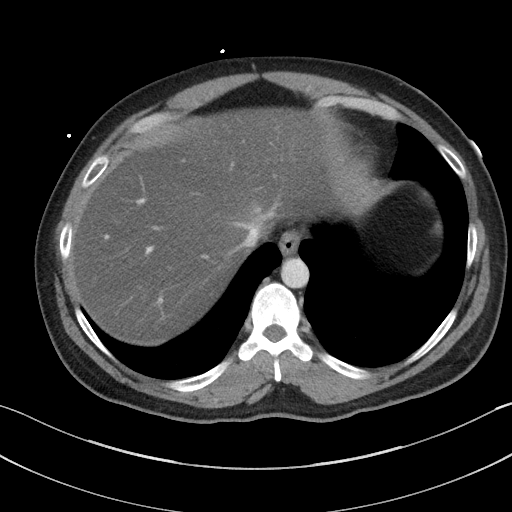
[im 89/93  soft-tissue]
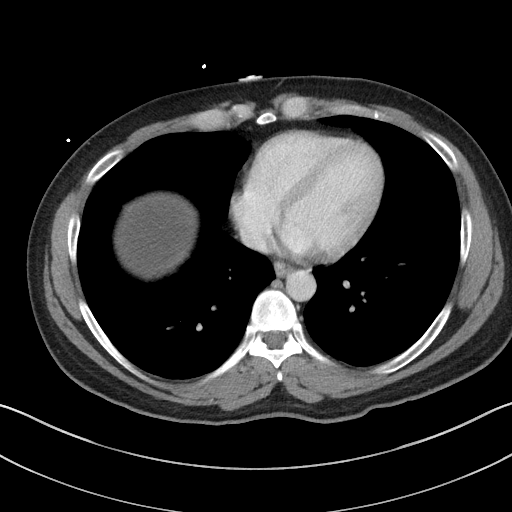

[Series 6: a/p w/ cor · coronal · 0.73mm/px · 3 of 129 slices shown]
[im 43/129  soft-tissue]
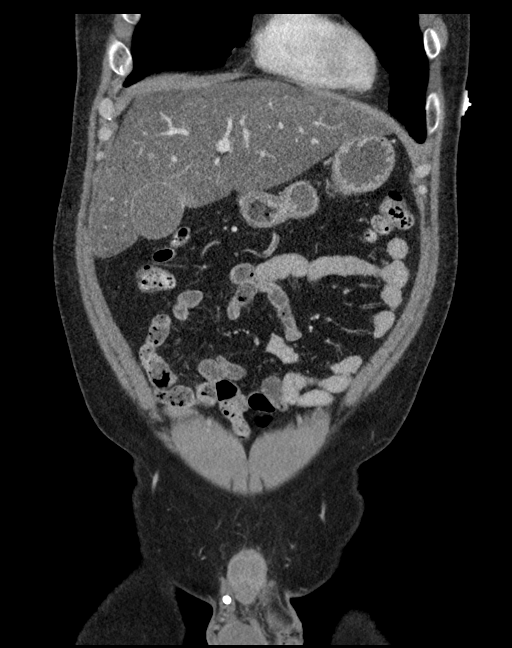
[im 57/129  soft-tissue]
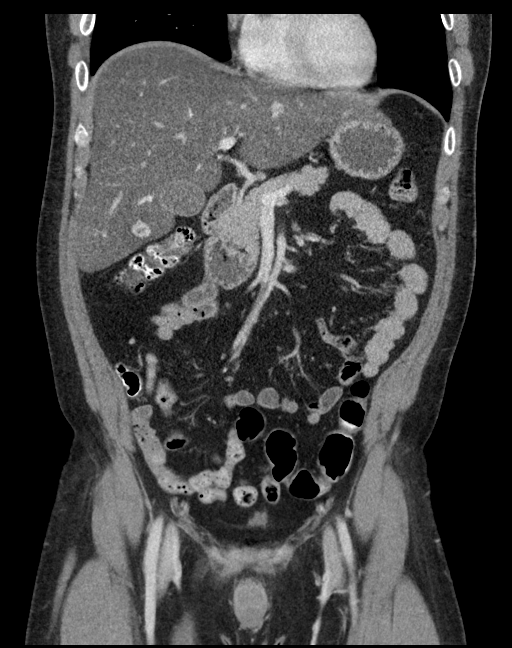
[im 72/129  soft-tissue]
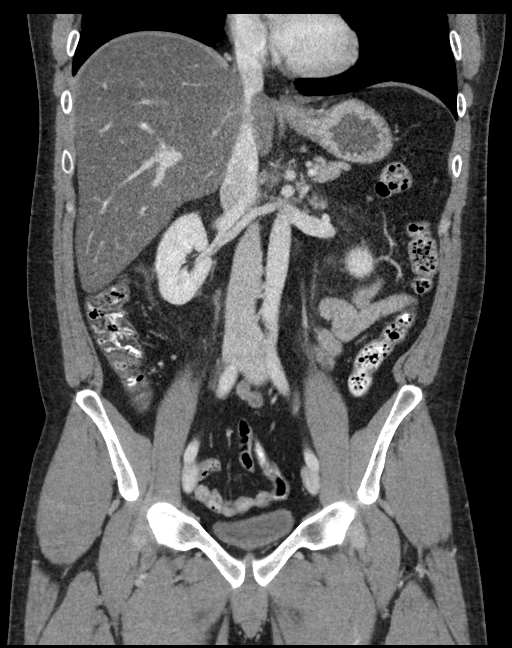

[16 of 46 positions shown; findings below may reference images not displayed]

FINDINGS: Lower chest:  No contributory findings.

Hepatobiliary: Marked steatosis. No cirrhotic changes. 12 mm
hypervascular mass in segment 5.No evidence of biliary obstruction
or stone.

Pancreas: Unremarkable.

Spleen: Unremarkable.

Adrenals/Urinary Tract: 11 mm left adrenal nodule with low-density
homogeneous appearance. No hydronephrosis or stone. Mild, symmetric
and nonspecific perinephric stranding. Unremarkable bladder.

Stomach/Bowel:  No obstruction. No appendicitis.

Vascular/Lymphatic: No acute vascular abnormality. No mass or
adenopathy.

Reproductive:No pathologic findings.

Other: No ascites or pneumoperitoneum.

Musculoskeletal: No acute abnormalities.
IMPRESSION: 1. Marked hepatic steatosis.
2. 12 mm hypervascular mass in liver segment 5. This is likely a
flash fill hemangioma but liver MRI characterization is recommended
given the patient's background liver disease.
3. 11 mm left adrenal nodule, statistically an adenoma. Attention on
follow-up.

## 2018-03-21 IMAGING — US US ABDOMEN LIMITED
1 series · 14 of 25 positions shown · non-contrast
Comparison: CT on 10/07/2016

CLINICAL DATA: Abdominal pain for 1 week.  HIV.

EXAM:
US ABDOMEN LIMITED - RIGHT UPPER QUADRANT

[Series 1: us abdomen limited · 0.22mm/px · 54 acquisitions, 14 frames shown]
[im 1/54]
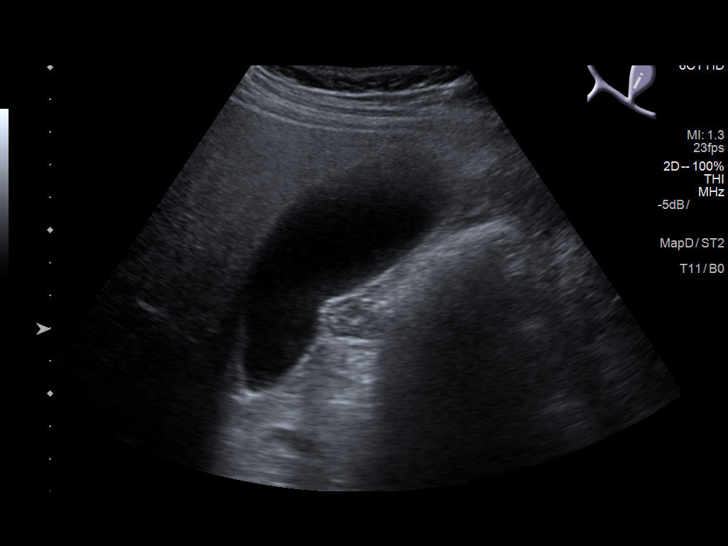
[im 5/54]
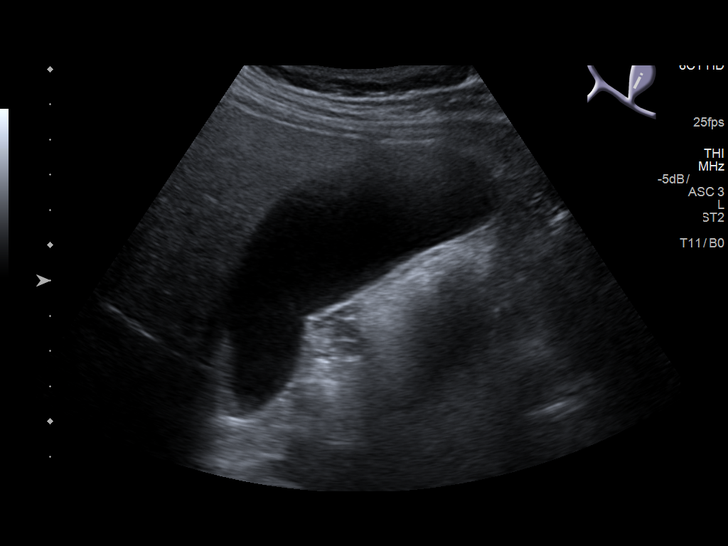
[im 9/54]
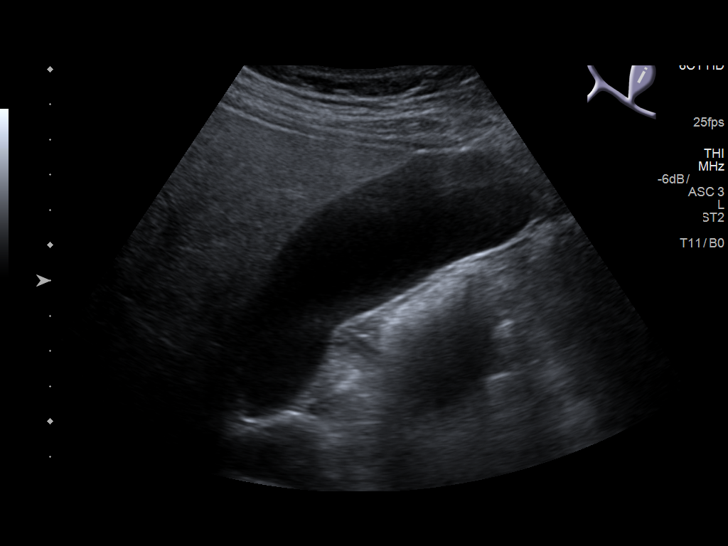
[im 14/54]
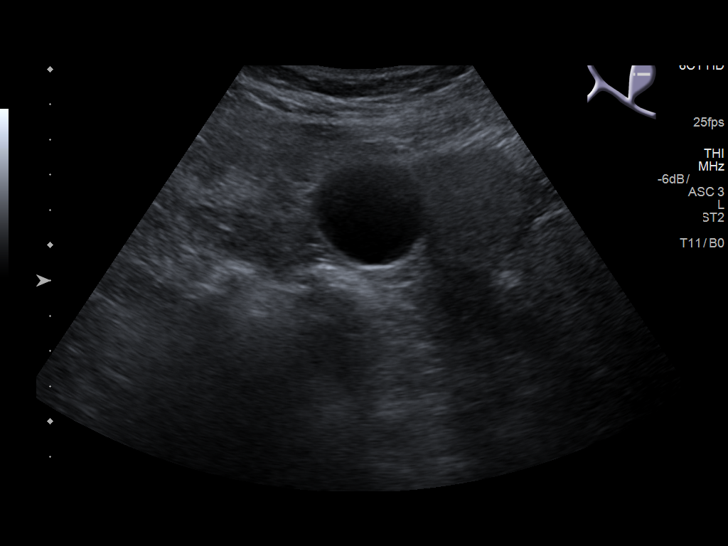
[im 18/54]
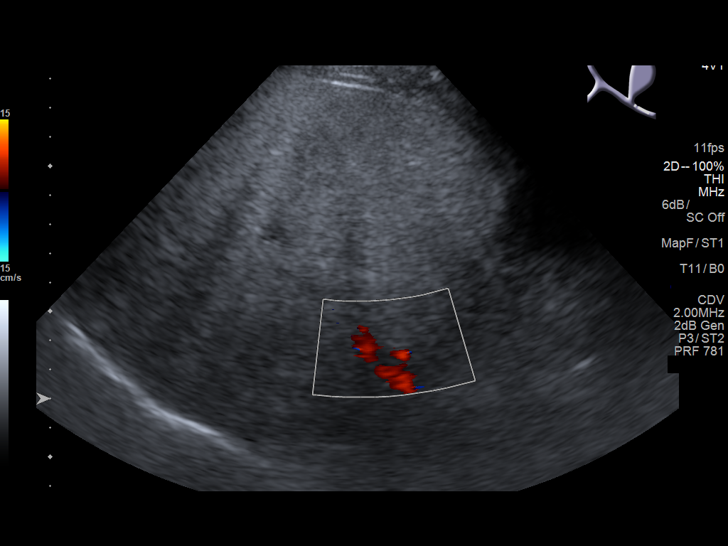
[im 20/54]
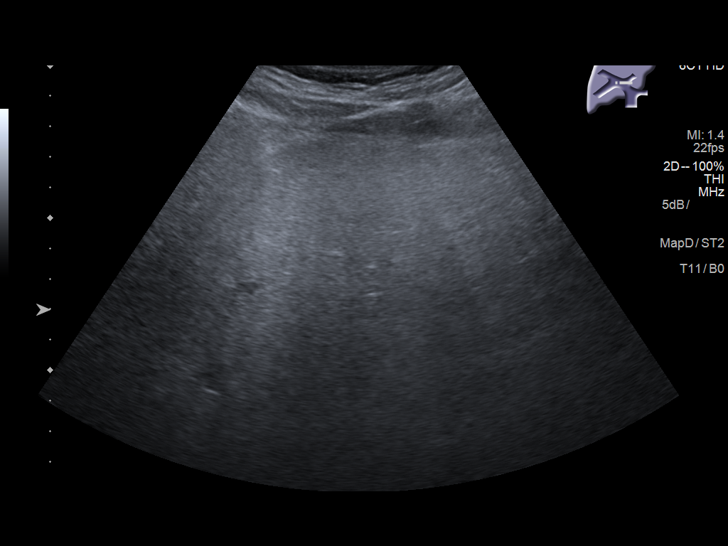
[im 25/54]
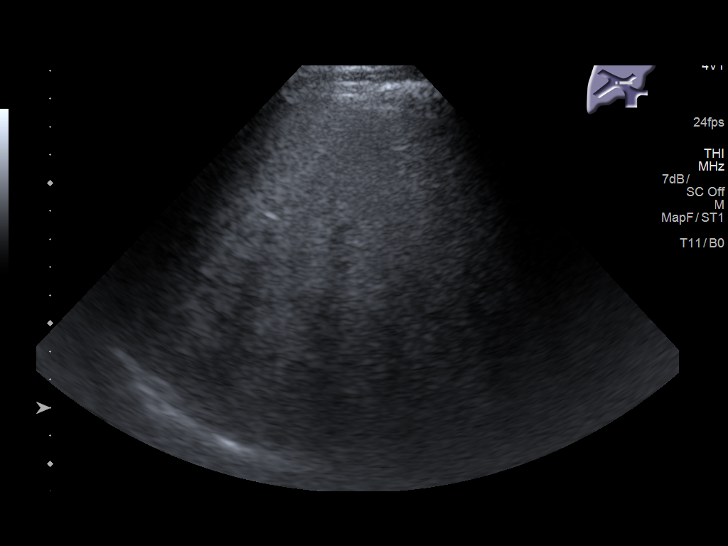
[im 29/54]
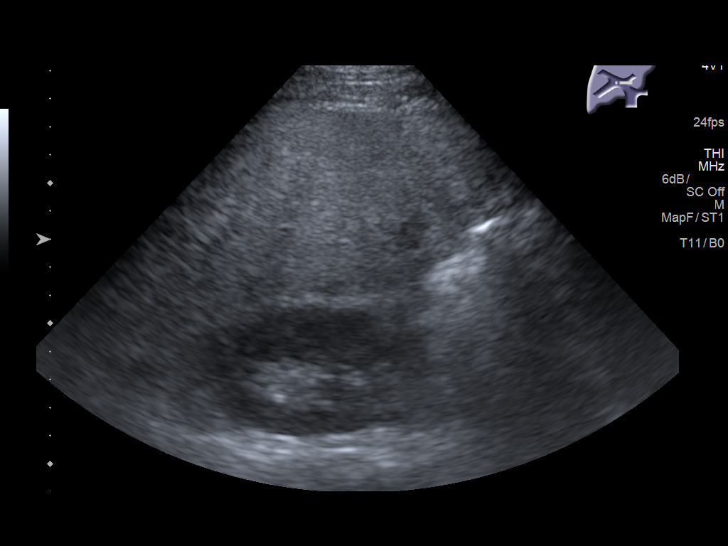
[im 34/54]
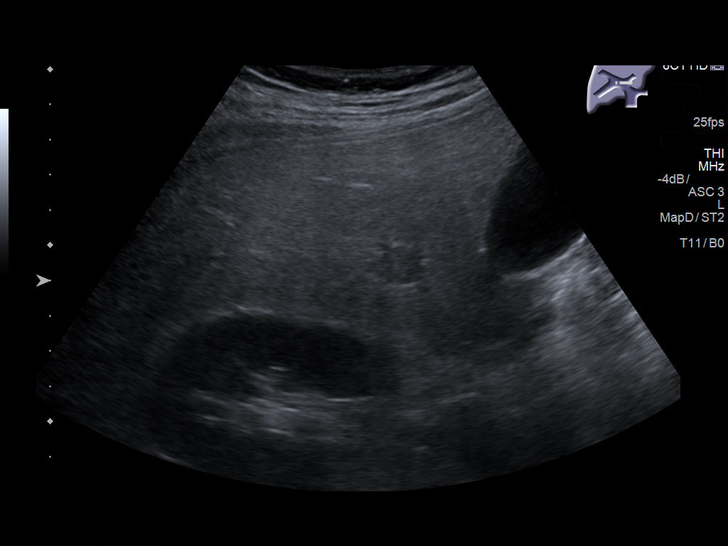
[im 36/54]
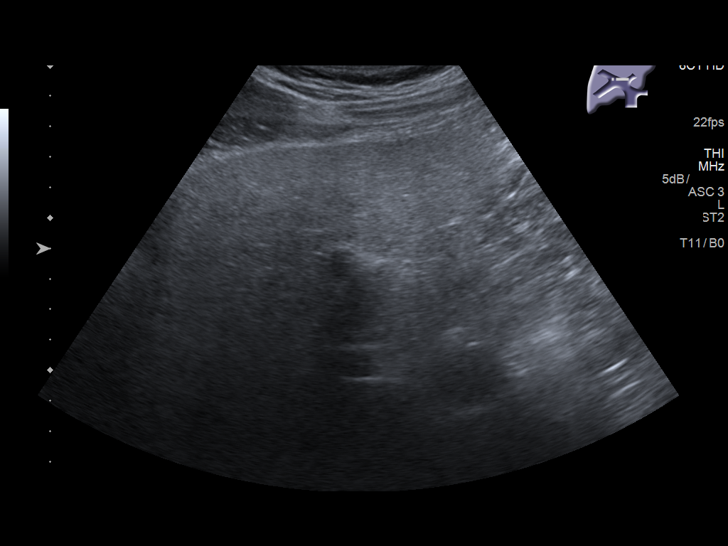
[im 40/54]
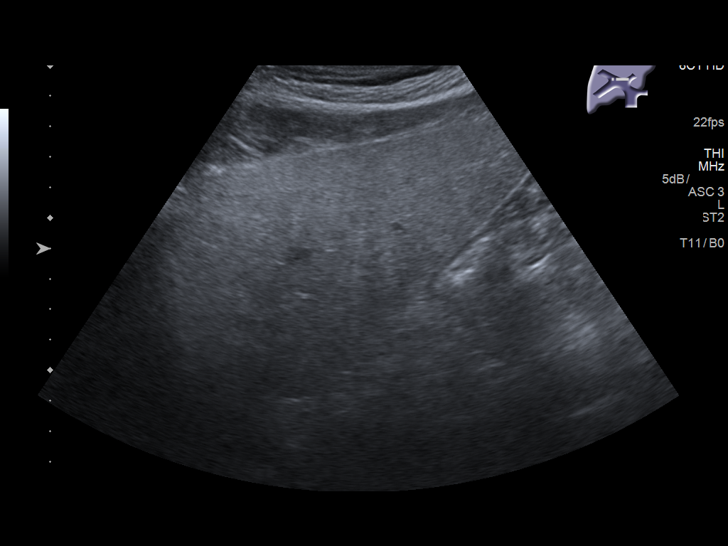
[im 45/54]
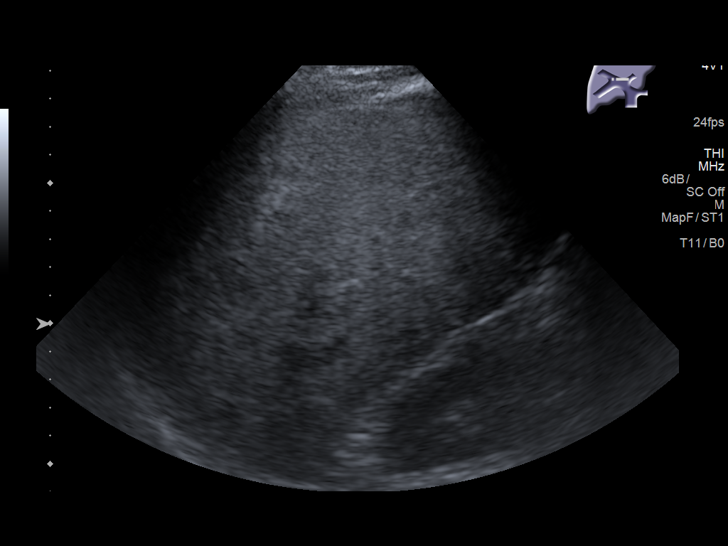
[im 49/54]
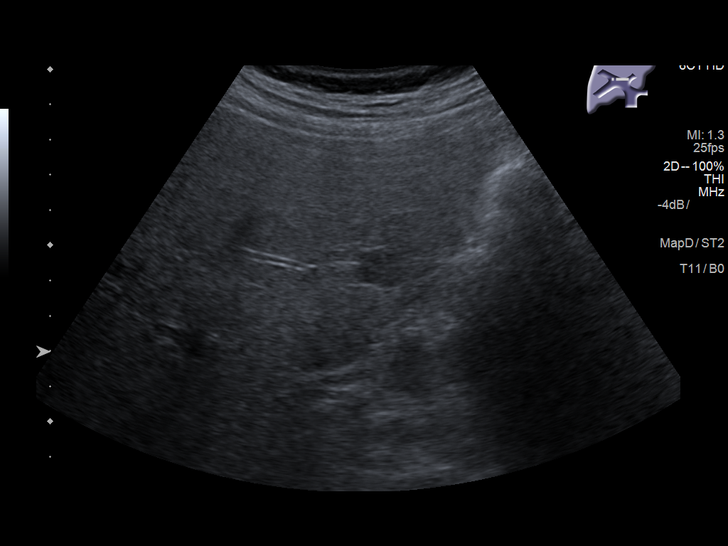
[im 54/54]
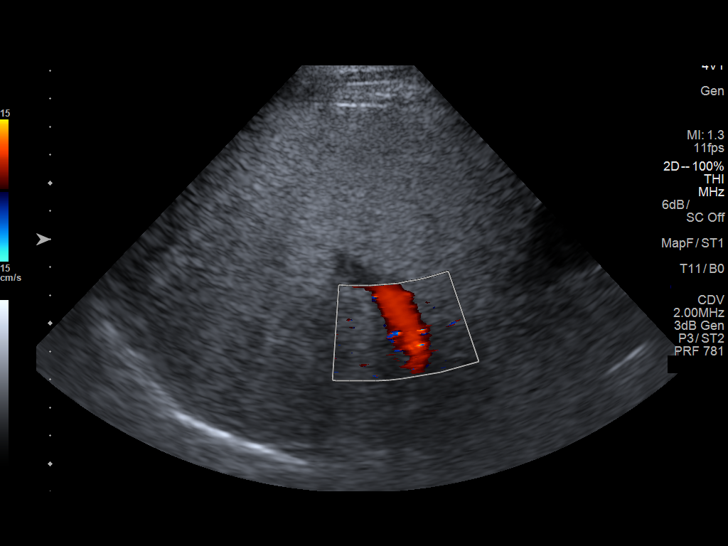

[14 of 25 positions shown; findings below may reference images not displayed]

FINDINGS: Gallbladder:

No gallstones or wall thickening visualized. No sonographic Murphy
sign noted by sonographer.

Common bile duct:

Diameter: 3 mm, within normal limits

Liver:

Diffusely increased attenuation of hepatic parenchyma seen,
consistent with diffuse hepatic steatosis. A 1.5 cm hypoechoic
lesion is seen in the right hepatic lobe which has nonspecific
sonographic features. No other definite liver masses visualized.
IMPRESSION: No evidence of gallstones or biliary ductal dilatation.

Diffuse hepatic steatosis. 1.5 cm nonspecific hypoechoic mass in
right hepatic lobe. Nonemergent abdomen MRI without and with
contrast recommended for further characterization.

## 2024-05-28 ENCOUNTER — Encounter: Payer: Self-pay | Admitting: Advanced Practice Midwife
